# Patient Record
Sex: Female | Born: 1940 | Race: White | Hispanic: No | Smoking: Never smoker
Health system: Southern US, Community
[De-identification: ages and names within clinical notes are randomized; demographics above are authoritative.]

## PROBLEM LIST (undated history)

## (undated) HISTORY — PX: ABDOMINAL HYSTERECTOMY: SHX81

---

## 2019-02-17 ENCOUNTER — Emergency Department (HOSPITAL_COMMUNITY)
Admission: EM | Admit: 2019-02-17 | Discharge: 2019-02-17 | Disposition: A | Discharge status: Home / Self Care | Payer: Medicare Other | Attending: Emergency Medicine | Admitting: Emergency Medicine

## 2019-02-17 ENCOUNTER — Other Ambulatory Visit: Payer: Self-pay

## 2019-02-17 ENCOUNTER — Emergency Department (HOSPITAL_COMMUNITY): Payer: Medicare Other

## 2019-02-17 ENCOUNTER — Encounter (HOSPITAL_COMMUNITY): Payer: Self-pay

## 2019-02-17 DIAGNOSIS — R531 Weakness: Secondary | ICD-10-CM | POA: Diagnosis not present

## 2019-02-17 DIAGNOSIS — R21 Rash and other nonspecific skin eruption: Secondary | ICD-10-CM | POA: Insufficient documentation

## 2019-02-17 DIAGNOSIS — K0889 Other specified disorders of teeth and supporting structures: Secondary | ICD-10-CM

## 2019-02-17 DIAGNOSIS — Z91040 Latex allergy status: Secondary | ICD-10-CM | POA: Insufficient documentation

## 2019-02-17 DIAGNOSIS — Z20828 Contact with and (suspected) exposure to other viral communicable diseases: Secondary | ICD-10-CM | POA: Diagnosis not present

## 2019-02-17 DIAGNOSIS — R6889 Other general symptoms and signs: Secondary | ICD-10-CM | POA: Diagnosis not present

## 2019-02-17 DIAGNOSIS — R51 Headache: Secondary | ICD-10-CM | POA: Diagnosis present

## 2019-02-17 DIAGNOSIS — R6883 Chills (without fever): Secondary | ICD-10-CM | POA: Diagnosis not present

## 2019-02-17 DIAGNOSIS — R519 Headache, unspecified: Secondary | ICD-10-CM

## 2019-02-17 LAB — COMPREHENSIVE METABOLIC PANEL
ALT: 34 U/L (ref 0–44)
AST: 28 U/L (ref 15–41)
Albumin: 3.9 g/dL (ref 3.5–5.0)
Alkaline Phosphatase: 70 U/L (ref 38–126)
Anion gap: 9 (ref 5–15)
BUN: 18 mg/dL (ref 8–23)
CO2: 26 mmol/L (ref 22–32)
Calcium: 8.9 mg/dL (ref 8.9–10.3)
Chloride: 101 mmol/L (ref 98–111)
Creatinine, Ser: 0.98 mg/dL (ref 0.44–1.00)
GFR calc Af Amer: >60 mL/min (ref >60)
GFR calc non Af Amer: 55 mL/min — ABNORMAL LOW (ref >60)
Glucose, Bld: 106 mg/dL — ABNORMAL HIGH (ref 70–99)
Potassium: 4 mmol/L (ref 3.5–5.1)
Sodium: 136 mmol/L (ref 135–145)
Total Bilirubin: 0.5 mg/dL (ref 0.3–1.2)
Total Protein: 7.9 g/dL (ref 6.5–8.1)

## 2019-02-17 LAB — CBC WITH DIFFERENTIAL/PLATELET
Abs Immature Granulocytes: 0.01 10*3/uL (ref 0.00–0.07)
Basophils Absolute: 0 10*3/uL (ref 0.0–0.1)
Basophils Relative: 0 %
Eosinophils Absolute: 0 10*3/uL (ref 0.0–0.5)
Eosinophils Relative: 0 %
HCT: 42 % (ref 36.0–46.0)
Hemoglobin: 13.6 g/dL (ref 12.0–15.0)
Immature Granulocytes: 0 %
Lymphocytes Relative: 17 %
Lymphs Abs: 0.8 10*3/uL (ref 0.7–4.0)
MCH: 30.7 pg (ref 26.0–34.0)
MCHC: 32.4 g/dL (ref 30.0–36.0)
MCV: 94.8 fL (ref 80.0–100.0)
Monocytes Absolute: 0.3 10*3/uL (ref 0.1–1.0)
Monocytes Relative: 6 %
Neutro Abs: 3.4 10*3/uL (ref 1.7–7.7)
Neutrophils Relative %: 77 %
Platelets: 242 10*3/uL (ref 150–400)
RBC: 4.43 MIL/uL (ref 3.87–5.11)
RDW: 11.9 % (ref 11.5–15.5)
WBC: 4.4 10*3/uL (ref 4.0–10.5)
nRBC: 0 % (ref 0.0–0.2)

## 2019-02-17 LAB — PROTIME-INR
INR: 1 (ref 0.8–1.2)
Prothrombin Time: 12.7 seconds (ref 11.4–15.2)

## 2019-02-17 LAB — SARS CORONAVIRUS 2 BY RT PCR (HOSPITAL ORDER, PERFORMED IN ~~LOC~~ HOSPITAL LAB): SARS Coronavirus 2: NEGATIVE

## 2019-02-17 LAB — LIPASE, BLOOD: Lipase: 47 U/L (ref 11–51)

## 2019-02-17 MED ORDER — DOXYCYCLINE HYCLATE 100 MG PO TABS
100.0000 mg | ORAL_TABLET | Freq: Once | ORAL | Status: AC
  Administered 2019-02-17: 22:00:00 100 mg via ORAL
  Filled 2019-02-17: qty 1

## 2019-02-17 MED ORDER — ONDANSETRON 4 MG PO TBDP
4.0000 mg | ORAL_TABLET | Freq: Once | ORAL | Status: AC
  Administered 2019-02-17: 22:00:00 4 mg via ORAL
  Filled 2019-02-17: qty 1

## 2019-02-17 MED ORDER — IOHEXOL 300 MG/ML  SOLN
100.0000 mL | Freq: Once | INTRAMUSCULAR | Status: AC | PRN
  Administered 2019-02-17: 20:00:00 100 mL via INTRAVENOUS

## 2019-02-17 MED ORDER — DOXYCYCLINE HYCLATE 100 MG PO CAPS: 100.0000 mg | ORAL_CAPSULE | Freq: Two times a day (BID) | ORAL | 0 refills | Status: AC

## 2019-02-17 MED ORDER — ONDANSETRON 4 MG PO TBDP: 4.0000 mg | ORAL_TABLET | Freq: Four times a day (QID) | ORAL | 0 refills | Status: AC | PRN

## 2019-02-17 MED ORDER — SODIUM CHLORIDE 0.9 % IV BOLUS
500.0000 mL | Freq: Once | INTRAVENOUS | Status: AC
  Administered 2019-02-17: 500 mL via INTRAVENOUS

## 2019-02-17 NOTE — ED Notes (Signed)
Pt c/o HA since tooth pulled earlier this month, pt denies calling the dentist that pulled the tooth.

## 2019-02-17 NOTE — ED Triage Notes (Signed)
Pt is having a headache for a few weeks. States she has a right earache as well. Had a tooth pulled on the 3rd and has been hurting ever since. Went to see PCP and he stated this sounded like a sinus infection. Pt here today due to symptoms not improving.

## 2019-02-17 NOTE — Discharge Instructions (Addendum)
Please take Tylenol (acetaminophen) to relieve your pain.  You may take tylenol, up to 1,000 mg (two extra strength pills).  Do not take more than 3,000 mg tylenol in a 24 hour period.  Please check all medication labels as many medications such as pain and cold medications may contain tylenol. Please do not drink alcohol while taking this medication.  Please stop taking the cefdinir.  Instead please start taking doxycycline.  Your primary care doctor will need to follow-up on your test for Lyme disease and [facility].  If you develop fevers with temperatures over 100.4 or have worsening concerns please seek additional medical care and evaluation.  Please be aware that doxycycline will make you very sensitive to the sun and likely to burn.    Today a coronavirus test was sent.  This will take multiple days to come back.  You may have diarrhea from the antibiotics.  It is very important that you continue to take the antibiotics even if you get diarrhea unless a medical professional tells you that you may stop taking them.  If you stop too early the bacteria you are being treated for will become stronger and you may need different, more powerful antibiotics that have more side effects and worsening diarrhea.  Please stay well hydrated and consider probiotics as they may decrease the severity of your diarrhea.

## 2019-02-17 NOTE — ED Provider Notes (Signed)
[facility] EMERGENCY DEPARTMENT Provider Note   CSN: [id number] Arrival date & time: 02/17/19  1304     History   Chief Complaint Chief Complaint  Patient presents with   Headache    HPI Cynthia Reid is a 78 y.o. female with no significant past medical history who presents today for evaluation of right-sided headache.  She reports that she had a tooth pulled on the third of the month and has had a headache since.  She has not been back to the dentist.  She reports that she had swelling in her right sided cheek.  She has not had temperatures over 100.4 however has had "low-grade" fevers with temperatures in the 99's.  She reports generally feeling unwell, fatigued, nauseous with no appetite.  She states that 11 days ago she was at a funeral and she got "bit up" by insects occluding some that she had to remove concerning for a tick.  She reports that on Thursday she developed worsening rash including rash on her face and head and has been feeling worse.  Her rash is mildly paretic.  She denies any new exposures.  She denies any vision changes or weakness.  She denies any trauma or falls.  No chest pain, cough, or shortness of breath.  No abdominal pain, dysuria increased frequency or urgency.  No flank pain.  When she saw her primary care doctor on Thursday they started her on p.o. cefdinir and PPI, reportedly gave her a steroid shot, and gave her triamcinolone cream for the rash on her face.     HPI  History reviewed. No pertinent past medical history.  There are no active problems to display for this patient.   Past Surgical History:  Procedure Laterality Date   ABDOMINAL HYSTERECTOMY       OB History   No obstetric history on file.      Home Medications    Prior to Admission medications   Medication Sig Start Date End Date Taking? Authorizing Provider  doxycycline (VIBRAMYCIN) 100 MG capsule Take 1 capsule (100 mg total) by mouth 2 (two) times daily. 02/17/19    Lorin Glass, PA-C  ondansetron (ZOFRAN ODT) 4 MG disintegrating tablet Take 1 tablet (4 mg total) by mouth every 6 (six) hours as needed for nausea or vomiting. 02/17/19   Lorin Glass, PA-C    Family History No family history on file.  Social History Social History   Tobacco Use   Smoking status: Never Smoker   Smokeless tobacco: Never Used  Substance Use Topics   Alcohol use: Never    Frequency: Never   Drug use: Never     Allergies   Latex, Niacin and related, and Sulfur   Review of Systems Review of Systems  Constitutional: Positive for chills and fatigue. Negative for fever.  HENT: Positive for dental problem, ear pain, mouth sores, sinus pressure and sinus pain. Negative for drooling and sore throat.   Eyes: Negative for photophobia and visual disturbance.  Gastrointestinal: Positive for nausea. Negative for abdominal pain, diarrhea and vomiting.  Musculoskeletal: Negative for back pain and neck pain.  Skin: Positive for rash.  Neurological: Positive for weakness (Generalized) and headaches. Negative for light-headedness.  All other systems reviewed and are negative.    Physical Exam Updated Vital Signs BP (!) 151/69 (BP Location: Left Arm)    Pulse 76    Temp 98.4 F (36.9 C) (Oral)    Resp 12    Ht 5' (1.524 m)  Wt 58.1 kg    SpO2 99%    BMI 25.00 kg/m   Physical Exam Vitals signs and nursing note reviewed.  Constitutional:      General: She is not in acute distress.    Appearance: She is well-developed. She is not diaphoretic.  HENT:     Head: Normocephalic and atraumatic.     Comments: No tenderness to percussion over bilateral frontal sinuses.  Mild tenderness to palpation over right maxillary sinus.  No obvious facial swelling.  No abnormal intraoral swelling or drainage.   Her face is generally erythematous and her scalp.  She has multiple patches of dark red on her face.    Mouth/Throat:     Mouth: Mucous membranes are moist.    Eyes:     General: No scleral icterus.       Right eye: No discharge.        Left eye: No discharge.     Extraocular Movements: Extraocular movements intact.     Conjunctiva/sclera: Conjunctivae normal.     Pupils: Pupils are equal, round, and reactive to light.  Neck:     Musculoskeletal: Normal range of motion.  Cardiovascular:     Rate and Rhythm: Normal rate and regular rhythm.     Heart sounds: Normal heart sounds. No murmur.  Pulmonary:     Effort: Pulmonary effort is normal. No respiratory distress.     Breath sounds: Normal breath sounds. No stridor.  Abdominal:     General: Bowel sounds are normal. There is no distension.     Palpations: Abdomen is soft.     Tenderness: There is no abdominal tenderness.  Musculoskeletal:        General: No deformity.  Skin:    General: Skin is warm and dry.     Comments:  a generalized red rash on bilateral upper and lower extremities, and torso and head.  Rash is scattered red papules with out drainage, pustule, or TTP.   Neurological:     Mental Status: She is alert and oriented to person, place, and time.     GCS: GCS eye subscore is 4. GCS verbal subscore is 5. GCS motor subscore is 6.     Cranial Nerves: No cranial nerve deficit.     Sensory: No sensory deficit.     Motor: No weakness or abnormal muscle tone.  Psychiatric:        Mood and Affect: Mood normal.        Speech: Speech normal.        Behavior: Behavior normal.      ED Treatments / Results  Labs (all labs ordered are listed, but only abnormal results are displayed) Labs Reviewed  COMPREHENSIVE METABOLIC PANEL - Abnormal; Notable for the following components:      Result Value   Glucose, Bld 106 (*)    GFR calc non Af Amer 55 (*)    All other components within normal limits  SARS CORONAVIRUS 2 (HOSPITAL ORDER, PERFORMED IN [facility])  PROTIME-INR  CBC WITH DIFFERENTIAL/PLATELET  LIPASE, BLOOD  ROCKY MTN SPOTTED FVR ABS PNL(IGG+IGM)  B.  BURGDORFI ANTIBODIES    EKG EKG Interpretation  Date/Time:  Saturday February 17 2019 15:45:55 EDT Ventricular Rate:  75 PR Interval:  154 QRS Duration: 80 QT Interval:  378 QTC Calculation: 422 R Axis:   -4 Text Interpretation:  Normal sinus rhythm Possible Left atrial enlargement Septal infarct , age undetermined Abnormal ECG No previous ECGs available Confirmed  by Vanetta MuldersZackowski, Scott [phone]([id number]) on 02/17/2019 3:49:54 PM   Radiology Ct Head Wo Contrast  Result Date: 02/17/2019 CLINICAL DATA:  Headache pain swelling and face. EXAM: CT HEAD WITHOUT CONTRAST TECHNIQUE: Contiguous axial images were obtained from the base of the skull through the vertex without intravenous contrast. Performed in conjunction with CT of the face with contrast, reported separately. COMPARISON:  None. FINDINGS: Brain: Brain volume is normal for age. No intracranial hemorrhage, mass effect, or midline shift. No hydrocephalus. The basilar cisterns are patent. No evidence of territorial infarct or acute ischemia. No extra-axial or intracranial fluid collection. Vascular: No hyperdense vessel. Skull: No fracture or focal lesion. Sinuses/Orbits: Assessed on concurrent face CT reported separately. Mastoid air cells are clear. Other: None. IMPRESSION: Unremarkable head CT for age. Electronically Signed   By: Narda RutherfordMelanie  Sanford M.D.   On: 02/17/2019 20:33   Ct Maxillofacial W Contrast  Result Date: 02/17/2019 CLINICAL DATA:  Pain since tooth pulling, question sinusitis EXAM: CT MAXILLOFACIAL WITH CONTRAST TECHNIQUE: Multidetector CT imaging of the maxillofacial structures was performed with intravenous contrast. Multiplanar CT image reconstructions were also generated. CONTRAST:  100mL OMNIPAQUE IOHEXOL 300 MG/ML  SOLN COMPARISON:  None. FINDINGS: Osseous: Empty right maxillary alveolus correlating with history of recent tooth extraction. No oral antral fistula. No osteitis or regional soft tissue inflammation. Orbits: Negative.  No  inflammation or mass. Sinuses: Negative.  No sinusitis Soft tissues: No evidence of inflammation. Limited intracranial: Negative IMPRESSION: No acute finding.  Negative for sinusitis. Electronically Signed   By: Marnee SpringJonathon  Watts M.D.   On: 02/17/2019 20:50    Procedures Procedures (including critical care time)  Medications Ordered in ED Medications  iohexol (OMNIPAQUE) 300 MG/ML solution 100 mL (100 mLs Intravenous Contrast Given 02/17/19 1958)  sodium chloride 0.9 % bolus 500 mL (0 mLs Intravenous Stopped 02/17/19 2212)  doxycycline (VIBRA-TABS) tablet 100 mg (100 mg Oral Given 02/17/19 2201)  ondansetron (ZOFRAN-ODT) disintegrating tablet 4 mg (4 mg Oral Given 02/17/19 2201)     Initial Impression / Assessment and Plan / ED Course  I have reviewed the triage vital signs and the nursing notes.  Pertinent labs & imaging results that were available during my care of the patient were reviewed by me and considered in my medical decision making (see chart for details).       Patient presents today for evaluation of headache and generally not feeling well and rash. Her headache has been present since she had a tooth pulled earlier this month.  She does not have any specific tenderness over the temporal artery or TMJ.  Based on continued headache CT head was obtained without evidence of acute abnormalities, and CT max face with contrast was obtained which does not show evidence of sinusitis or other infection.  She is neurologically intact on exam.  Recommended follow-up with her dentist.  Patient also reported generally feeling unwell and a rash.  She has had this worsening over the past week.  She states that she was bitten by multiple bugs and may have had a tick that she removed however is unclear.  She had previously been on cefdinir for a possible dental infection, however given CT scan does not support this will discontinue that and instead start her on doxycycline.  [facility] were sent given possible exposure.  Recommended evaluating environment for other causes such as bedbugs/fleas that may cause rash.  He is generally nontoxic appearing.  Will give prescription for Zofran to help  with her general feelings of unwell.  CBC, CMP, and lipase were obtained without evidence of acute abnormalities.  EKG does not show evidence of ischemia.  Stressed to the importance of primary care follow-up to both patient and her son.  Return precautions were discussed with patient who states their understanding.  At the time of discharge patient denied any unaddressed complaints or concerns.  Patient is agreeable for discharge home.     Final Clinical Impressions(s) / ED Diagnoses   Final diagnoses:  Acute nonintractable headache, unspecified headache type  Rash and nonspecific skin eruption  Feeling unwell  Pain, dental    ED Discharge Orders         Ordered    doxycycline (VIBRAMYCIN) 100 MG capsule  2 times daily     02/17/19 2146    ondansetron (ZOFRAN ODT) 4 MG disintegrating tablet  Every 6 hours PRN     02/17/19 2146           Cristina GongHammond, Lanasia Porras W, PA-C 02/17/19 2250    Vanetta MuldersZackowski, Scott, MD 02/24/19 1221

## 2019-02-17 NOTE — ED Provider Notes (Signed)
Medical screening examination/treatment/procedure(s) were conducted as a shared visit with non-physician practitioner(s) and myself.  I personally evaluated the patient during the encounter.  EKG Interpretation  Date/Time:  Saturday February 17 2019 15:45:55 EDT Ventricular Rate:  75 PR Interval:  154 QRS Duration: 80 QT Interval:  378 QTC Calculation: 422 R Axis:   -4 Text Interpretation:  Normal sinus rhythm Possible Left atrial enlargement Septal infarct , age undetermined Abnormal ECG No previous ECGs available Confirmed by Fredia Sorrow [phone]) on 02/17/2019 3:49:54 PM   Patient seen by me along with the physician assistant.  Patient presenting with a myriad of complaints.  The basis of his patient is complained of a headache since she had a tooth pulled earlier this month.  Headache has persisted.  Then she developed a facial rash and some rash on her lower extremity upper extremity.  There is some not clear history of possibly some kind of bugs on her that she had to remove.  So possibly tick.  So there is some concern for tickborne illness.  The rash on her face is bilateral may be even a little suggestive may be lupus.  We will do work-up here to include titers CT head and face EKG was sinus rhythm.  Patient's vital signs she is afebrile.  Not tachycardic.  Not toxic.  Primary care doctor is in the [place] area.   Fredia Sorrow, MD 02/17/19 [phone]

## 2019-02-19 LAB — B. BURGDORFI ANTIBODIES: B burgdorferi Ab IgG+IgM: <0.91 {ISR} (ref 0.00–0.90)

## 2019-02-20 LAB — ROCKY MTN SPOTTED FVR ABS PNL(IGG+IGM)
RMSF IgG: NEGATIVE
RMSF IgM: 0.12 index (ref 0.00–0.89)

## 2020-05-02 IMAGING — CT CT HEAD WITHOUT CONTRAST
3 of 7 series · 16 of 47 positions shown, 19 images · IV contrast (Isovue)
Comparison: None.

CLINICAL DATA: Headache pain swelling and face.

EXAM:
CT HEAD WITHOUT CONTRAST
TECHNIQUE: Contiguous axial images were obtained from the base of the skull
through the vertex without intravenous contrast.
Performed in conjunction with CT of the face with contrast, reported
separately.

[Series 4: coronal soft · coronal · 0.29mm/px · 3 of 62 slices shown]
[im 15/62  brain]
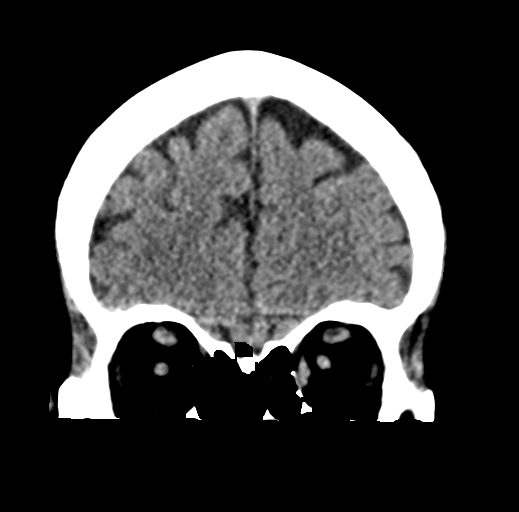
[im 30/62  brain]
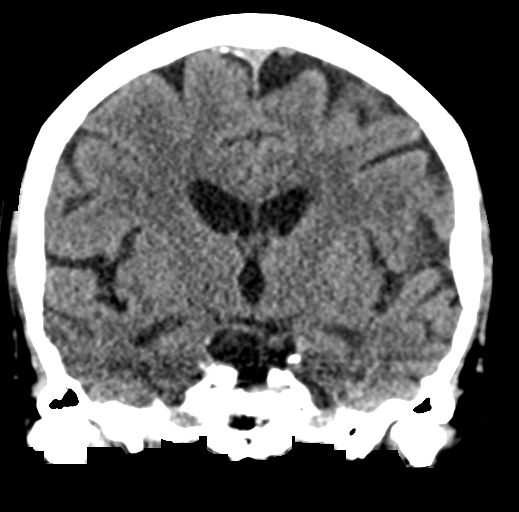
[im 45/62  brain]
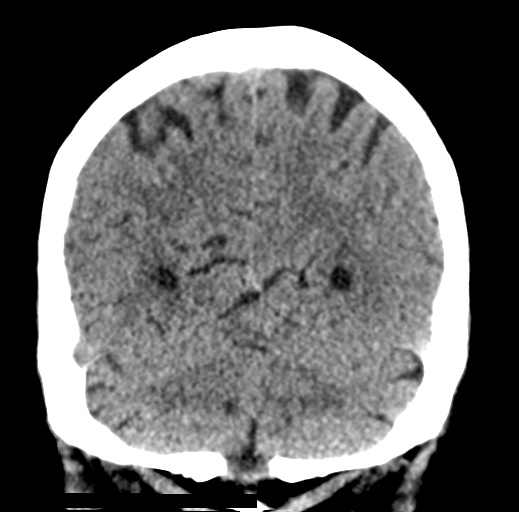

[Series 6: max soft · axial · 0.39mm/px · z∈[+1251,+1391]mm · 12 of 78 slices shown, 15 images]
[im 4/78  brain]
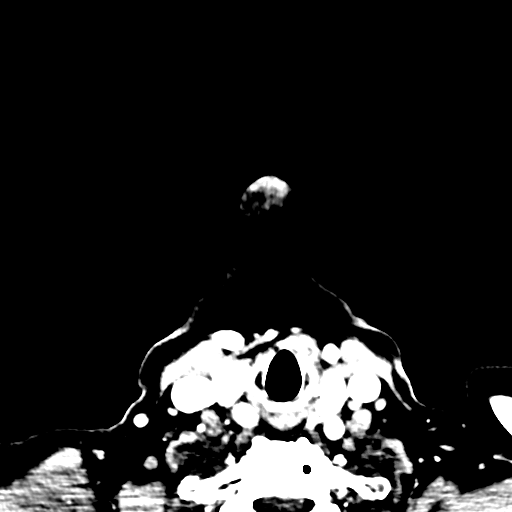
[im 4/78  bone]
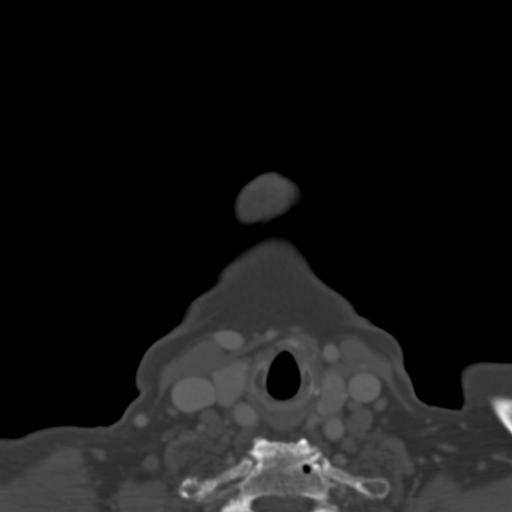
[im 12/78  brain]
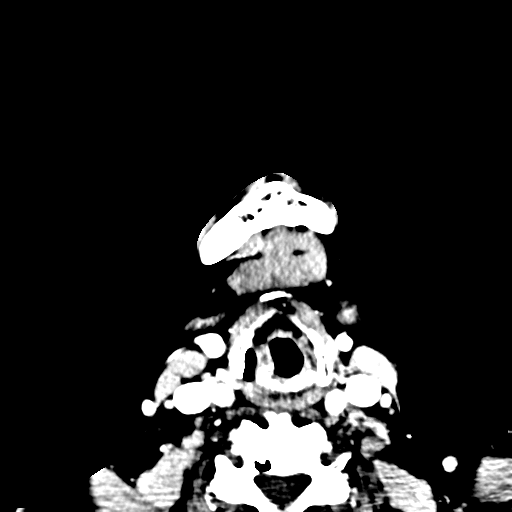
[im 19/78  brain]
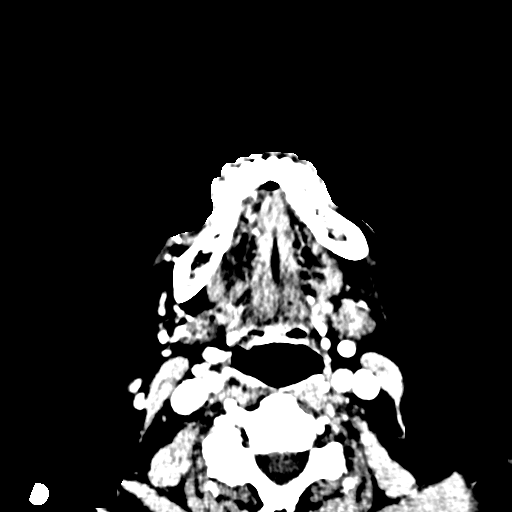
[im 23/78  brain]
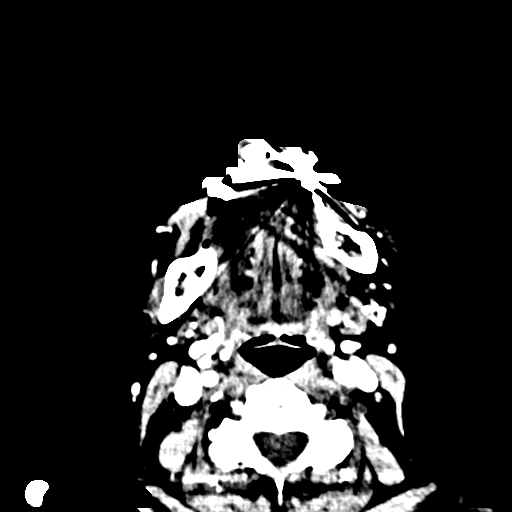
[im 30/78  brain]
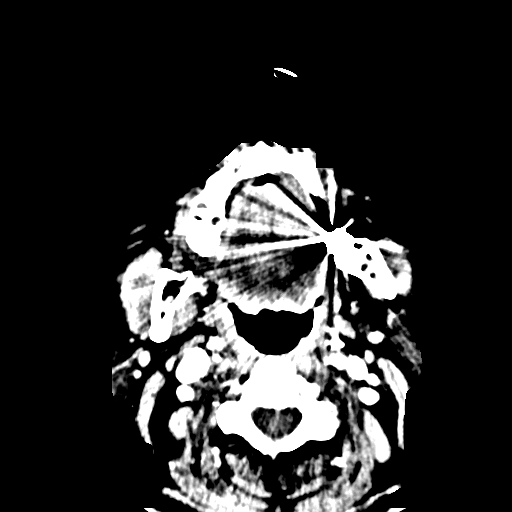
[im 30/78  bone]
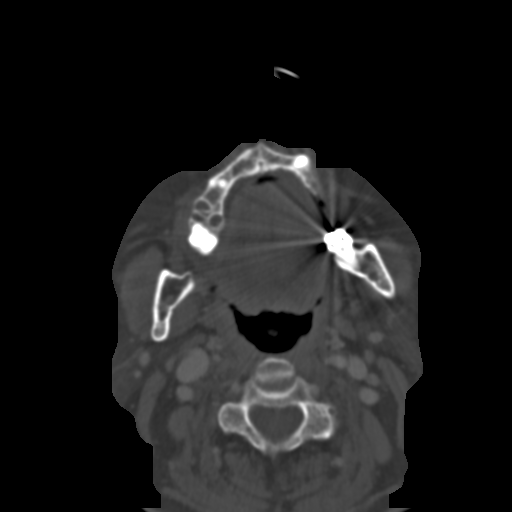
[im 37/78  brain]
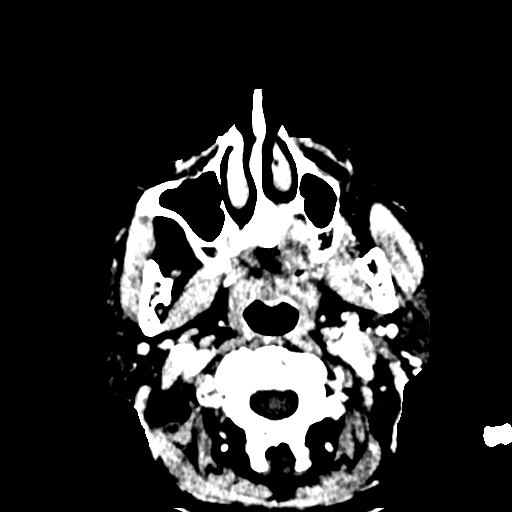
[im 41/78  brain]
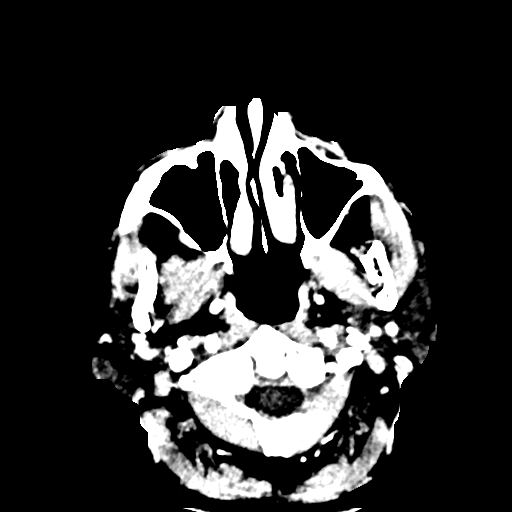
[im 48/78  brain]
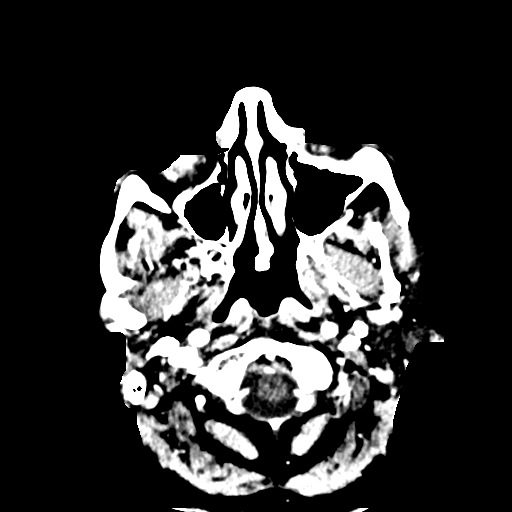
[im 56/78  brain]
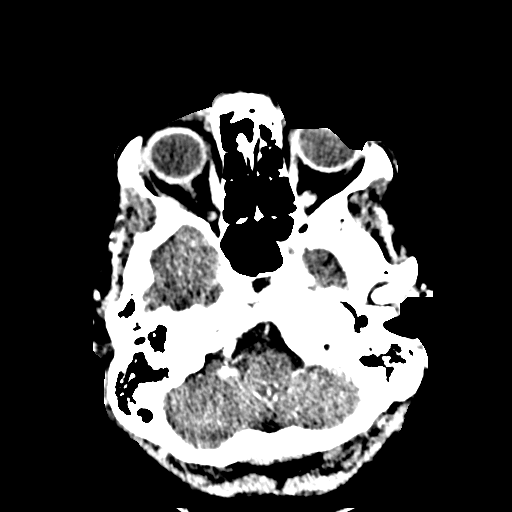
[im 56/78  bone]
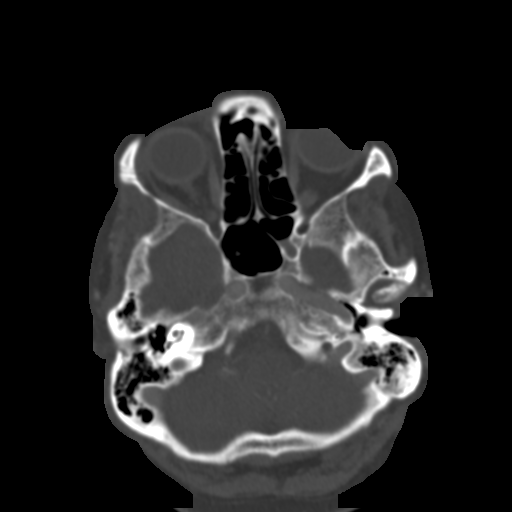
[im 59/78  brain]
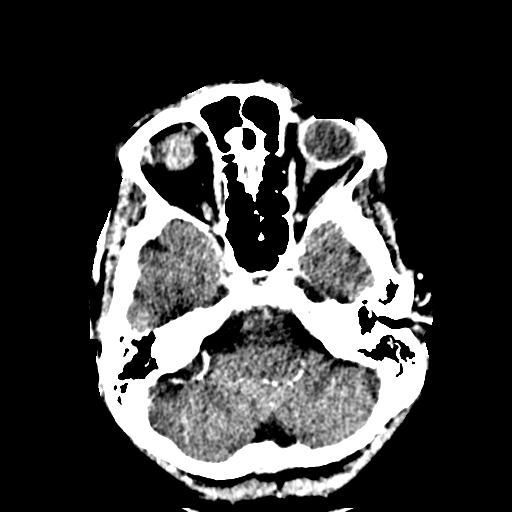
[im 67/78  brain]
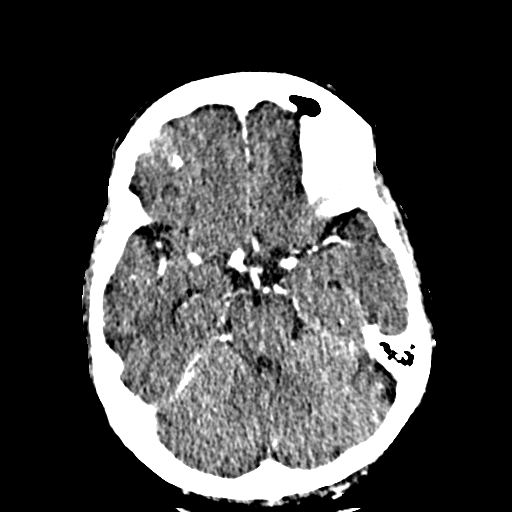
[im 74/78  brain]
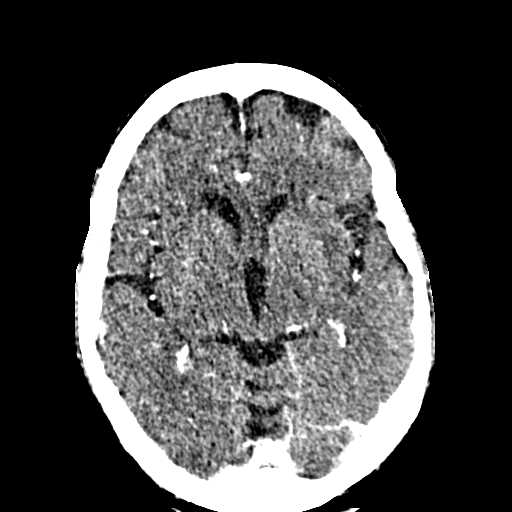

[Series 9: sagittal soft · sagittal · 0.32mm/px · 1 of 77 slices shown]
[im 39/77  brain]
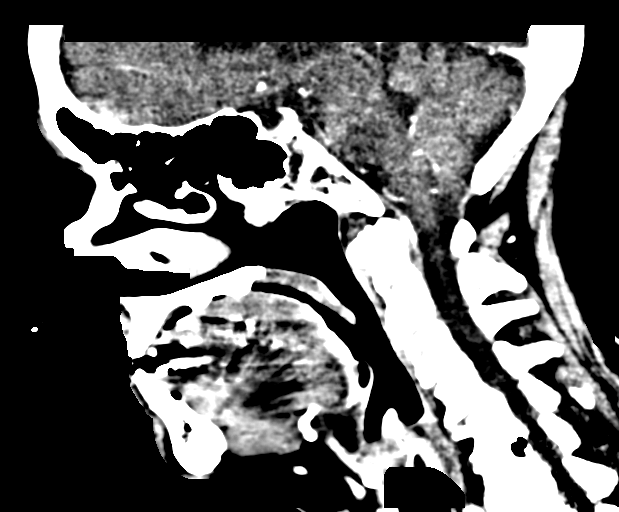

[16 of 47 positions shown; findings below may reference images not displayed]

FINDINGS: Brain: Brain volume is normal for age. No intracranial hemorrhage,
mass effect, or midline shift. No hydrocephalus. The basilar
cisterns are patent. No evidence of territorial infarct or acute
ischemia. No extra-axial or intracranial fluid collection.

Vascular: No hyperdense vessel.

Skull: No fracture or focal lesion.

Sinuses/Orbits: Assessed on concurrent face CT reported separately.
Mastoid air cells are clear.

Other: None.
IMPRESSION: Unremarkable head CT for age.
